# Patient Record
Sex: Male | Born: 1940 | Race: White | Hispanic: No | Smoking: Current every day smoker
Health system: Southern US, Community
[De-identification: ages and names within clinical notes are randomized; demographics above are authoritative.]

## PROBLEM LIST (undated history)

## (undated) DIAGNOSIS — I739 Peripheral vascular disease, unspecified: Secondary | ICD-10-CM

## (undated) DIAGNOSIS — F329 Major depressive disorder, single episode, unspecified: Secondary | ICD-10-CM

## (undated) DIAGNOSIS — E039 Hypothyroidism, unspecified: Secondary | ICD-10-CM

## (undated) DIAGNOSIS — F0150 Vascular dementia without behavioral disturbance: Secondary | ICD-10-CM

## (undated) DIAGNOSIS — J449 Chronic obstructive pulmonary disease, unspecified: Secondary | ICD-10-CM

## (undated) DIAGNOSIS — I10 Essential (primary) hypertension: Secondary | ICD-10-CM

## (undated) DIAGNOSIS — F32A Depression, unspecified: Secondary | ICD-10-CM

## (undated) DIAGNOSIS — F419 Anxiety disorder, unspecified: Secondary | ICD-10-CM

## (undated) DIAGNOSIS — E079 Disorder of thyroid, unspecified: Secondary | ICD-10-CM

## (undated) DIAGNOSIS — F0390 Unspecified dementia without behavioral disturbance: Secondary | ICD-10-CM

## (undated) DIAGNOSIS — I350 Nonrheumatic aortic (valve) stenosis: Secondary | ICD-10-CM

## (undated) HISTORY — DX: Nonrheumatic aortic (valve) stenosis: I35.0

## (undated) HISTORY — DX: Peripheral vascular disease, unspecified: I73.9

## (undated) HISTORY — DX: Major depressive disorder, single episode, unspecified: F32.9

## (undated) HISTORY — DX: Anxiety disorder, unspecified: F41.9

## (undated) HISTORY — DX: Vascular dementia, unspecified severity, without behavioral disturbance, psychotic disturbance, mood disturbance, and anxiety: F01.50

## (undated) HISTORY — PX: OTHER SURGICAL HISTORY: SHX169

## (undated) HISTORY — DX: Essential (primary) hypertension: I10

## (undated) HISTORY — DX: Depression, unspecified: F32.A

## (undated) HISTORY — DX: Hypothyroidism, unspecified: E03.9

---

## 2000-12-11 ENCOUNTER — Encounter: Payer: Self-pay | Admitting: Family Medicine

## 2000-12-11 ENCOUNTER — Ambulatory Visit (HOSPITAL_COMMUNITY): Admission: RE | Admit: 2000-12-11 | Discharge: 2000-12-11 | Payer: Self-pay | Admitting: Family Medicine

## 2001-03-04 ENCOUNTER — Emergency Department (HOSPITAL_COMMUNITY): Admission: EM | Admit: 2001-03-04 | Discharge: 2001-03-04 | Payer: Self-pay | Admitting: Emergency Medicine

## 2001-05-10 ENCOUNTER — Encounter: Payer: Self-pay | Admitting: Family Medicine

## 2001-05-10 ENCOUNTER — Ambulatory Visit (HOSPITAL_COMMUNITY): Admission: RE | Admit: 2001-05-10 | Discharge: 2001-05-10 | Payer: Self-pay | Admitting: Family Medicine

## 2002-01-07 ENCOUNTER — Ambulatory Visit (HOSPITAL_COMMUNITY): Admission: RE | Admit: 2002-01-07 | Discharge: 2002-01-07 | Payer: Self-pay | Admitting: Family Medicine

## 2002-01-07 ENCOUNTER — Encounter: Payer: Self-pay | Admitting: Family Medicine

## 2005-07-21 DIAGNOSIS — I739 Peripheral vascular disease, unspecified: Secondary | ICD-10-CM

## 2005-07-21 HISTORY — DX: Peripheral vascular disease, unspecified: I73.9

## 2006-02-22 ENCOUNTER — Emergency Department (HOSPITAL_COMMUNITY): Admission: EM | Admit: 2006-02-22 | Discharge: 2006-02-22 | Payer: Self-pay | Admitting: Emergency Medicine

## 2006-03-17 ENCOUNTER — Ambulatory Visit (HOSPITAL_COMMUNITY): Admission: RE | Admit: 2006-03-17 | Discharge: 2006-03-19 | Payer: Self-pay | Admitting: Vascular Surgery

## 2006-03-17 ENCOUNTER — Ambulatory Visit: Payer: Self-pay | Admitting: Cardiology

## 2006-03-18 ENCOUNTER — Encounter: Payer: Self-pay | Admitting: Cardiology

## 2006-03-26 ENCOUNTER — Encounter (INDEPENDENT_AMBULATORY_CARE_PROVIDER_SITE_OTHER): Payer: Self-pay | Admitting: Specialist

## 2006-03-26 ENCOUNTER — Inpatient Hospital Stay (HOSPITAL_COMMUNITY): Admission: RE | Admit: 2006-03-26 | Discharge: 2006-03-31 | Payer: Self-pay | Admitting: Vascular Surgery

## 2006-03-27 ENCOUNTER — Encounter: Payer: Self-pay | Admitting: Vascular Surgery

## 2006-11-19 IMAGING — CR DG FOOT COMPLETE 3+V*L*
3 series · 3 of 3 positions shown · non-contrast
Comparison: none

CLINICAL DATA: Fourth and fifth digits of left foot are black. 
 LEFT FOOT ? 3 VIEW:

[view not recorded (1 of 3)]
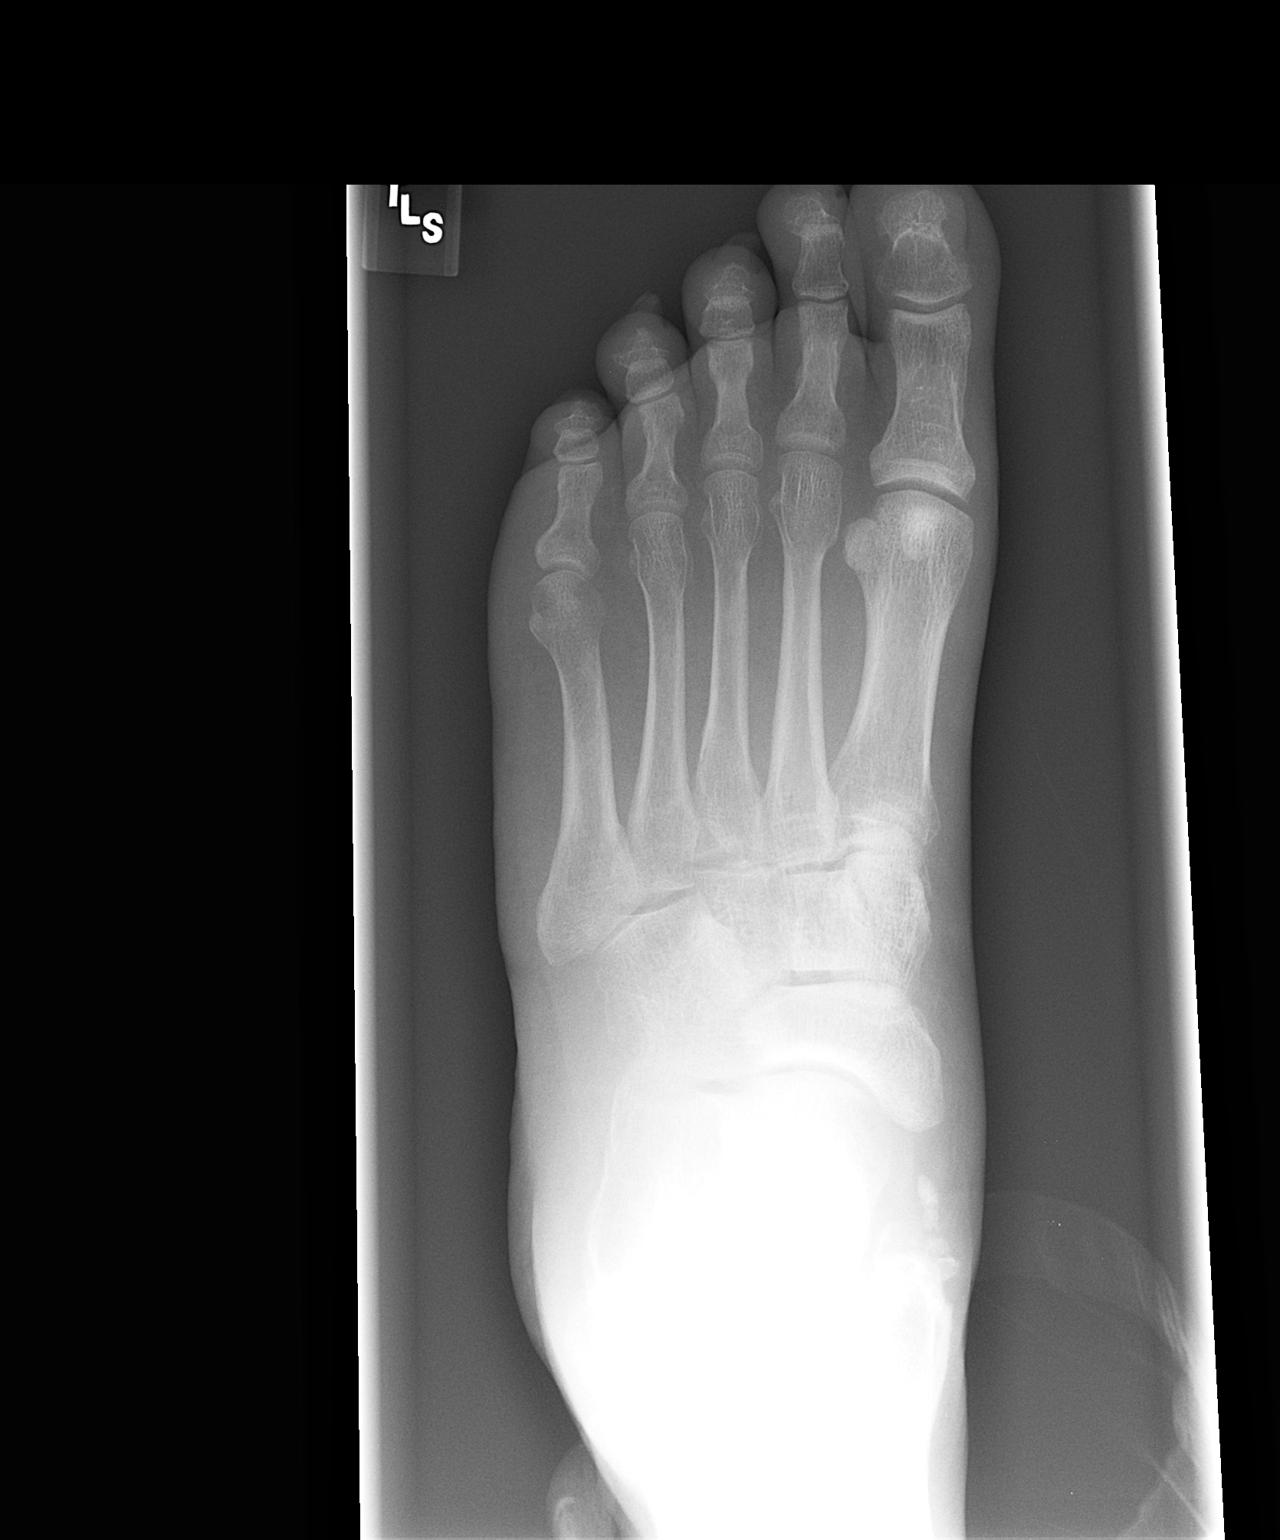

[view not recorded (2 of 3)]
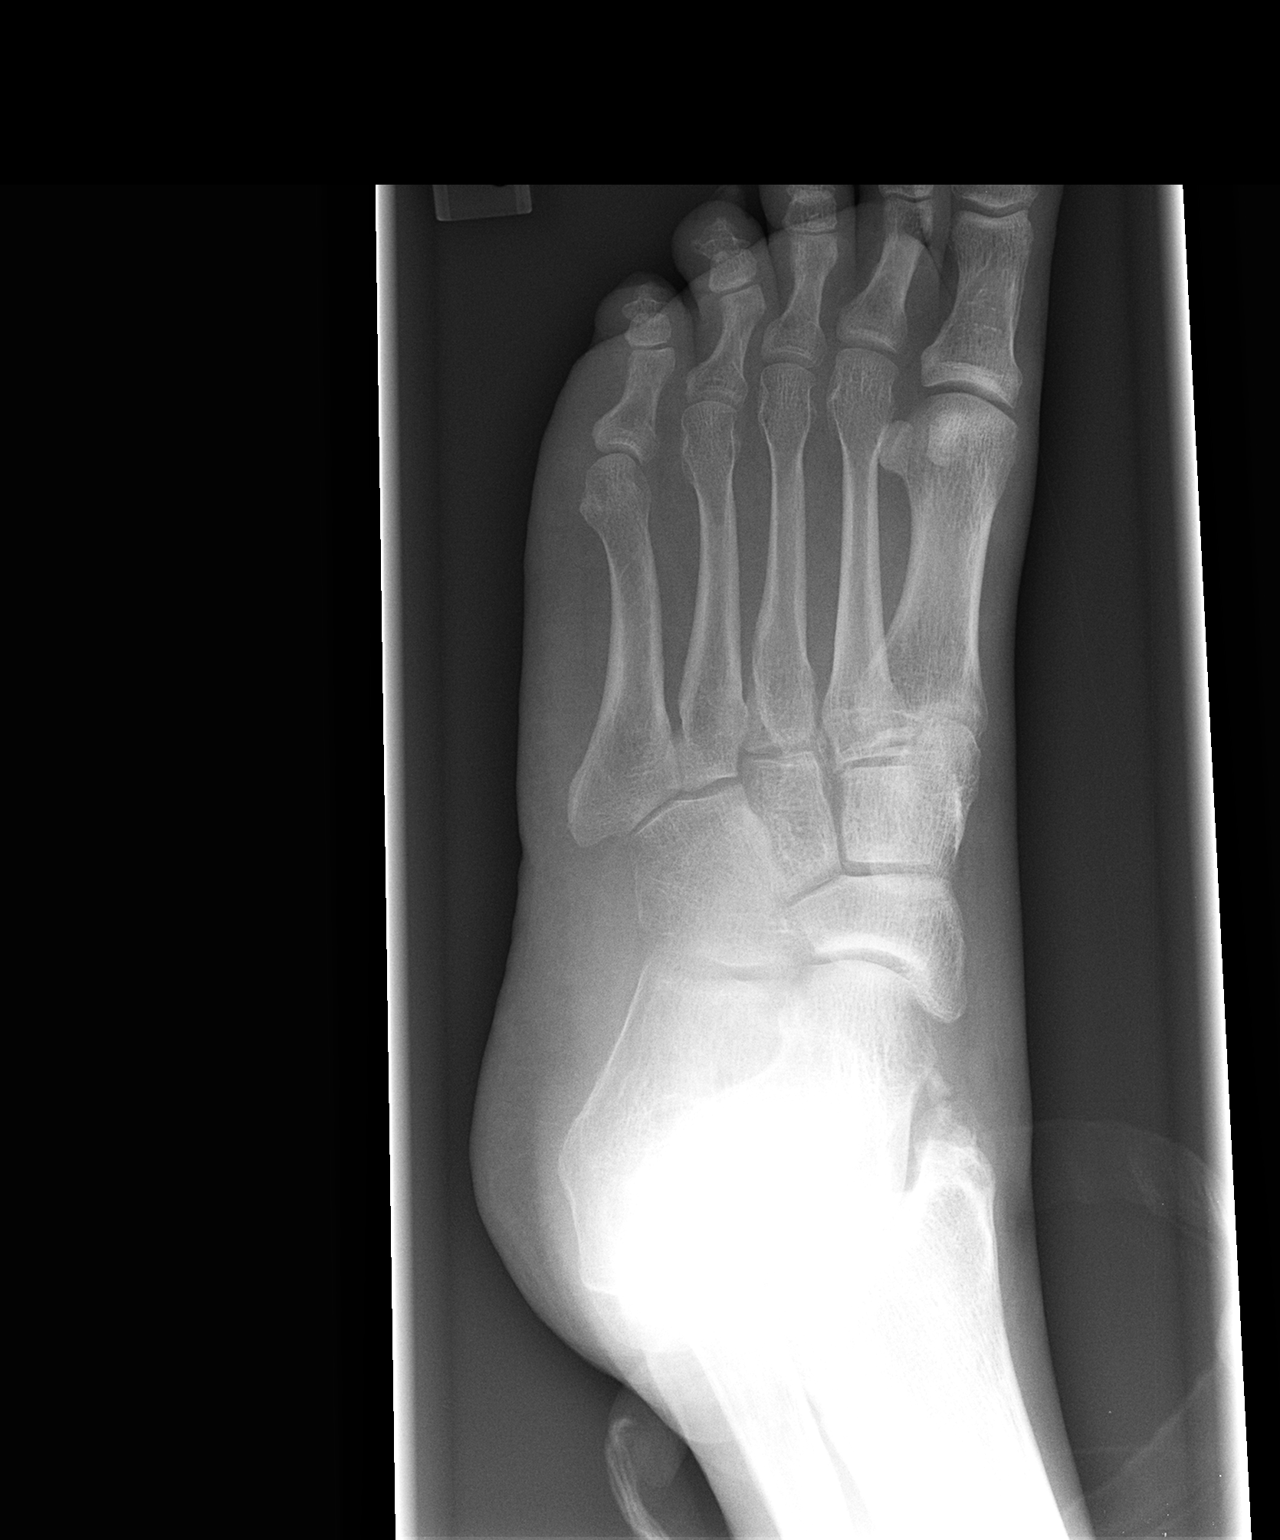

[view not recorded (3 of 3)]
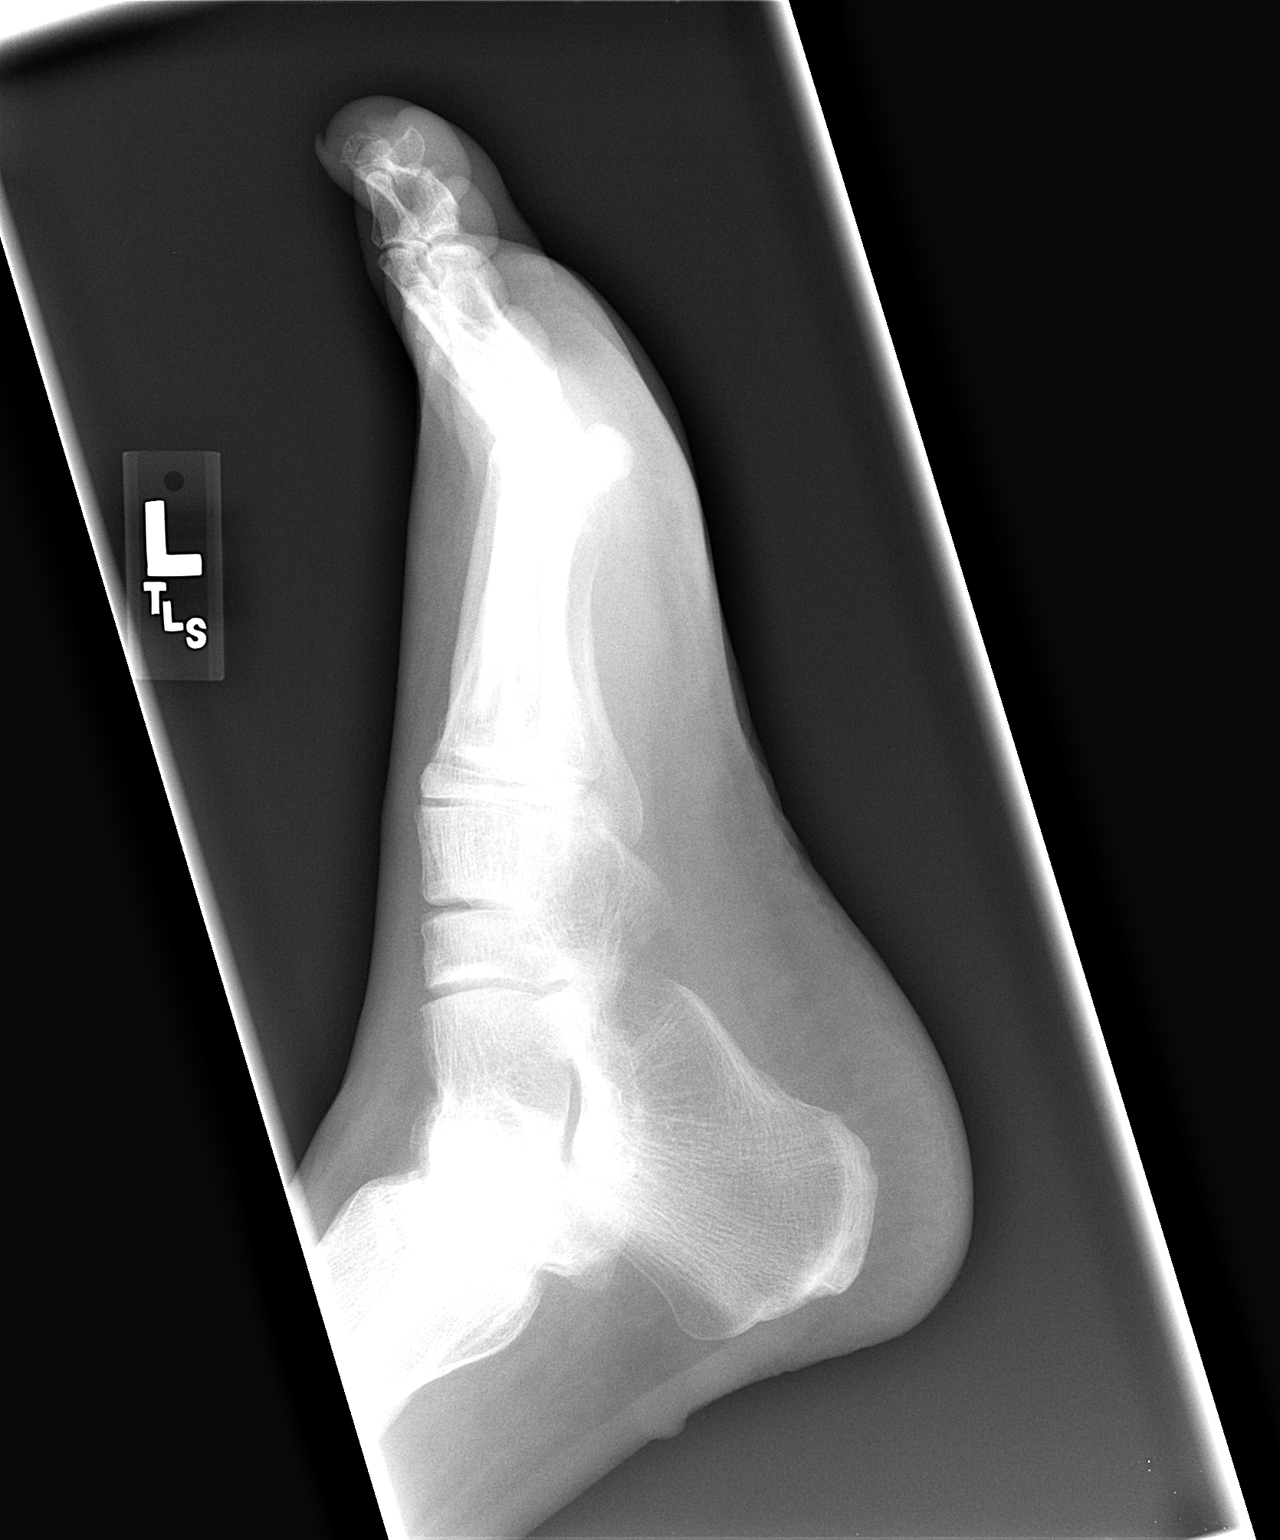

[3 of 3 positions shown; findings below may reference images not displayed]

FINDINGS: There are no acute radiographic abnormalities.  Specifically, no fractures or dislocations.
IMPRESSION: No acute findings.

## 2010-12-06 NOTE — Consult Note (Addendum)
NAME:  Jacob Barton, Jacob Barton               ACCOUNT NO.:  [id number]   MEDICAL RECORD NO.:  [id number]          PATIENT TYPE:  OIB   LOCATION:  2807                         FACILITY:  [facility]   PHYSICIAN:  Madolyn Frieze. 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  He has no prior cardiac history by report.  He  is a poor historian, but he denies any exertional chest pain.  He does have  some dyspnea on exertion, but there is no orthopnea or PND.  He recently was  found to have peripheral vascular disease after complaining of toe  infection/pain.  ABIs at [facility] revealed 0.35 on the left and 0.5 on the  right.  He had an arteriogram today, as well as a left iliac stent.  He also  needs left femoral popliteal bypass surgery.  Cardiology is asked to  evaluate preoperatively.  He is presently on Vicodin as needed and no other  medications.  HE IS ALLERGIC TO PENICILLIN.   PAST MEDICAL HISTORY:  He denies any hypertension or hyperlipidemia and he  denies any diabetes mellitus.  He has questionable history of mental  retardation.  He has had previous left ankle surgery.   FAMILY HISTORY:  His family history is positive for coronary artery disease  in his mother, father and siblings.  Age is unknown.   SOCIAL HISTORY:  He has a long history of tobacco abuse, but he does not  consume alcohol.   REVIEW OF SYSTEMS:  He denies any headaches, fevers or chills.  There is no  productive cough or hemoptysis.  There is no dysphagia, odynophagia, melena  or hematochezia.  There is no dysuria or hematuria.  There is no ____ QA                                                           MARKER: 118____ for seizure activity.  There  is no orthopnea, PND or pe  edema by his report.  The remainder systems are negative.   PHYSICAL EXAM:  VITAL SIGNS:  His blood pressure is 154/60 and his pulse is  60.  He has a respiratory rate of 18.  GENERAL:  He is well-developed, well-nourished in no acute distress.  His  skin is warm and dry.  He does not appear to be depressed and there is no  peripheral clubbing.  HEENT:  His HEENT is unremarkable with ____ QA MARKER: 136 ____.       NECK:  His neck is supple with a normal ____ QA MARKER: 139  ____bilaterally and I cannot appreciate bruits.  There is no  distention and no thyromegaly is noted.  CHEST:  His chest is clear to auscultation anteriorly.  Expansion is normal.  Note, the patient is lying flat following his arteriogram.  ABDOMINAL EXAM:  Nontender.  Positive bowel sounds.  No hepatosplenomegaly.  No masses appreciated.  There is no abdominal bruit.  He has sheaths in  place in both femoral arteries.  EXTREMITIES:  His extremities show no edema.  On the left, there are  necrotic fourth and fifth digits.  I cannot palpate dorsalis pedis pulses.  NEUROLOGICAL EXAM:  Is grossly intact, although his mentation is somewhat  diminished.   His electrocardiogram shows normal sinus rhythm with left ventricular  hypertrophy.   DIAGNOSES:  1. Preoperative evaluation prior to peripheral vascular surgery.  2. Peripheral vascular disease.  3. Ischemic fourth and fifth digits on the left lower extremity.  4. Probable chronic obstructive pulmonary disease.  5. Hypertension.  6. Tobacco abuse.   PLAN:  [name] is for preop evaluation prior to peripheral vascular  surgery.  He has demonstrated significant peripheral vascular disease and  also has a long history of tobacco use and a positive family history.  He  also has hypertension.  I, therefore feel that we should risk stratify prior  to his surgery and we will schedule him for  an adenosine Myoview.  I will  also schedule him to have an echocardiogram.  If there is no significant  ischemia, then I think he can proceed safely.  I think, given his vascular  disease, he would warrant aspirin, Lopressor and a statin.  He will need  followup lipids and liver functions in 6 weeks.  He will need to discontinue  his tobacco use.  We will follow while he is in the hospital.           ______________________________  Madolyn Frieze. Jens Som, MD,FACC     BSC/MEDQ  D:  03/17/2006  T:  03/17/2006  Job:  [id number]

## 2010-12-06 NOTE — Op Note (Signed)
NAME:  Jacob Barton, Jacob Barton               ACCOUNT NO.:  [id number]   MEDICAL RECORD NO.:  [id number]          PATIENT TYPE:  INP   LOCATION:  2550                         FACILITY:  [facility]   PHYSICIAN:  Janetta Hora. Fields, MD  DATE OF BIRTH:  1940/12/29   DATE OF PROCEDURE:  03/26/2006  DATE OF DISCHARGE:                                 OPERATIVE REPORT   PROCEDURES:  1. Left femoral to below-knee popliteal bypass with nonreversed greater      saphenous vein.  2. Intraoperative arteriogram.  3. 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   PREOPERATIVE DIAGNOSIS:  Gangrene toes left foot.   POSTOPERATIVE DIAGNOSIS:  Gangrene toes left foot.   ANESTHESIA:  General.   ASSISTANT:  Wayne Doylene Canning, PA-C.   OPERATIVE FINDINGS:  1. A 3 mm greater saphenous vein, nonreversed.  2. Lymph nodes sent for pathology.  3. Two-vessel runoff via peroneal and posterior tibial artery.   OPERATIVE DETAILS:  After obtaining informed consent, the patient was taken  to the operating room.  The patient was placed in the supine position on the  operating table.  After induction of general anesthesia and endotracheal  intubation, a Foley catheter was placed.  Next the patient's entire left and  right lower extremities were prepped and draped in the usual sterile  fashion.  A longitudinal incision was made in the left groin over the left  common femoral artery.  Dissection was carried down to the level of the left  common femoral artery.  There were two large lymph nodes overlying this.  These were excised and sent to pathology as a specimen.  Next, the left  common femoral artery was dissected free circumferentially.  Profunda  femoris and superficial femoral arteries were dissected free  circumferentially. A few small side branches were also dissected free  circumferentially and controlled with vessel loops.  Next, attention was  turned to the greater saphenous vein.  This was harvested from the  saphenofemoral  junction through several skip incisions down the leg.  The  vein was of good quality in its proximal half and then became fairly small  but was approximately 3 mm in diameter at its distalmost portion.  Next, an  incision was made on the medial aspect of the leg on the left side.  Incision was made through the muscle and carried down into the popliteal  space.  The popliteal artery was dissected free circumferentially.  It was  quite inflamed adjacent to the artery. Proximal and distal control were  obtained with vessel loops.  The greater saphenous vein was then fully  excised through skip incisions.  The distal portion was doubly clipped.  It  was then transected.  The vein was then harvested all the way up to the  level of the saphenofemoral junction.  This was controlled with a right-  angle clamp and the vein transected and placed in vein preservation  solution.  The stump of the saphenofemoral junction was then oversewn with a  5-0 Prolene suture.  Next, the vein was thoroughly irrigated and the dilated  with heparinized saline.  Small side branches were inspected.  They were  ligated divided between silk ties or clips.  The vein was fairly small in  its lower portion, and this required repair with sutures of 7-0 Prolene in  multiple locations.  After the vein had been distended, the patient was  given 5000 units of intravenous heparin.  A tunnel was created from the  below-knee incision between the heads of the gastrocnemius and subsartorial  up to the groin just prior to heparinization.  Next, the common femoral  artery was controlled with a Cooley clamp.  Distal control was obtained with  vessel loops.  A longitudinal arteriotomy was made just above the femoral  bifurcation.  The vein was spatulated and sewn end of vein to side of artery  using a running 6-0 Prolene suture.  Just prior to completion of the  anastomosis, this was forward bled, backbled, and thoroughly flushed.   Anastomosis was secured. Clamps were released.  The vein had been placed in  a nonreversed configuration.  A valvulotome was then brought up on the  operative field, and the valves were lysed.  The graft was then clamped  distally with a fine bulldog clamp and marked with a pen for orientation.  The graft was then taken through the subsartorial tunnel down to the level  of the below-knee popliteal space.  The popliteal artery was controlled  proximally and distally with vessel loops.  The vein was cut to length with  the leg straightened.  A longitudinal arteriotomy was made in the side of  the popliteal artery, and the vein was then sewn end of vein to side of  artery using a running 6-0 Prolene suture.  Just prior to completion of the  anastomosis, this was forward bled, backbled, and thoroughly flushed.  On  forward bleeding the vein graft, however, the flow was not brisk.  Therefore, a #3 Fogarty catheter was passed through the graft a couple of  times.  No clot was retrieved, but the inflow was significantly improved.  The anastomosis was then secured.  The graft was then first flushed up the  proximal popliteal artery and then down into the distal popliteal artery.  Foot was pink and warm immediately.  Hemostasis was obtained.  Next, a  completion arteriogram was obtained using inflow occlusion on the vein  graft, and a 21 gauge butterfly needle was introduced into the proximal  portion of the vein graft.  This showed a widely patent anastomosis, with  runoff via the peroneal and posterior tibial arteries.  The anterior tibial  artery was occluded in the mid-leg.  Next, the butterfly needle was removed,  and a single 6-0 Prolene suture used to repair the hole.  Hemostasis was  obtained in all incisions.  Deep layers of all incisions were closed with  running 2-0 Vicryl suture.  Superficial layers were closed with running 3-0 Vicryl suture.  Skin of all incisions was then closed with  staples.  Attention was then turned to the foot.  The patient had gangrene of the toes  4 and 5 on the left foot.  Left foot was prepped and draped in sterile  technique.  A circumferential incision was made around the base of the  fourth and fifth toes.  These were excised with bone cutters and passed off  table as specimen.  Bone was then rongeured back until there no tension no  tension on the skin edges.  The skin edges  were then thoroughly irrigated  with normal saline solution.  This was then reapproximated with interrupted  3-0 nylon sutures.  Dry sterile dressings were applied.  The patient  tolerated the procedure well, and there were no complications.  Instrument,  sponge, and needle counts were correct at the case.  The patient was taken  to the recovery room in stable condition.      Janetta Hora. Fields, MD  Electronically Signed     CEF/MEDQ  D:  03/26/2006  T:  03/26/2006  Job:  [id number]

## 2010-12-06 NOTE — Op Note (Signed)
NAME:  Jacob Barton, Jacob Barton               ACCOUNT NO.:  [id number]   MEDICAL RECORD NO.:  [id number]          PATIENT TYPE:  OIB   LOCATION:  5016                         FACILITY:  [facility]   PHYSICIAN:  Janetta Hora. Fields, MD  DATE OF BIRTH:  03-01-1941   DATE OF PROCEDURE:  03/17/2006  DATE OF DISCHARGE:  03/19/2006                                 OPERATIVE REPORT   PROCEDURE:  1. Aortogram with bilateral lower extremity run off.  2. Angioplasty and stenting of left external and common iliac artery.   PREOPERATIVE DIAGNOSIS:  Gangrene left foot.   POSTOPERATIVE DIAGNOSIS:  Gangrene left foot.   ANESTHESIA:  Local.   ASSISTANT:  Nurse.   OPERATIVE DETAIL:  After obtaining informed consent, the patient taken to  the PV lab.  The patient placed in supine position on the angio table.  Next, both groins were prepped and draped in the usual sterile fashion.  Local anesthesia was infiltrated over the right common femoral artery.  A  Majestic needle was used to cannulate the right common femoral artery.  A  0.035 J-tip guide wire threaded into the abdominal aorta under fluoroscopic  guidance.  Next a 5-French sheath was placed over the guidewire in the right  femoral artery.  5-French pigtail catheter was then placed over the  guidewire into the abdominal aorta.  Abdominal aortogram was obtained.  This  shows the left and right renal arteries are widely patent.  The abdominal  aorta has mild atherosclerotic changes.  The right common iliac artery has  mild narrowing at its origin approximately 10%.  Left common iliac artery is  patent at its origin.  There is a high-grade stenosis in the left common  iliac/external iliac artery right at the iliac bifurcation on the left side.  The right side has no significant stenosis.   Several oblique views were obtained of the pelvis to further demonstrate the  high-grade stenosis and the common external iliac junction on the left side.   Next lower  extremity runoff views were obtained.  This shows that the distal  left and right external iliac arteries were widely patent.  The left and  right common femoral arteries were patent.  The origin of the superficial  femoral artery is patent bilaterally but this occludes in the mid thigh.  The profunda femoris artery is patent bilaterally.  The above-knee popliteal  artery is reconstituted right at the adductor hiatus on the right.  The  above-knee popliteal artery is reconstituted on the left just above the  femoral condyle.  Popliteal artery is patent bilaterally.  On the right leg  there is filling of the anterior tibial artery but this occludes just after  its origin.  The peroneal artery on the right side is patent at its origin  but occludes in the mid leg.  The dominant runoff vessel on the right side  is the posterior tibial artery.  On the left side anterior tibial artery  does fill but is moderately diseased and small.  Again on the left side, the  posterior tibial artery is the dominant  runoff vessel.  The peroneal artery  is fairly diminutive.   Next, local anesthesia was infiltrated over the left common femoral artery.  Majestic needle was used to cannulate the left common femoral artery and a  0.035 Wholey wire advanced into the left iliac system.  A 6-French bright  tip sheath was then placed over the guidewire into the left iliac system.  The patient was given 5000 units of intravenous heparin.  A 7 mm diameter  stent was brought up in the operative field and after using a marker take  the precise area for stent deployment was determined with half the stent  deployed above and below the iliac bifurcation.  Completion arteriogram  shows no significant residual stenosis.  Additionally the left internal  iliac artery is widely patent.   Next the guidewires were removed and the sheath left in place to be removed  in the holding area until the patient's ACT had come down to  reasonable  value.  The patient tolerated procedure well and there were no  complications.  The patient taken to the holding area in stable condition.      Janetta Hora. Fields, MD  Electronically Signed     CEF/MEDQ  D:  04/21/2006  T:  04/22/2006  Job:  [id number]

## 2010-12-06 NOTE — Op Note (Signed)
NAME:  Jacob Barton, Jacob Barton               ACCOUNT NO.:  [id number]   MEDICAL RECORD NO.:  [id number]          PATIENT TYPE:  OIB   LOCATION:  5016                         FACILITY:  [facility]   PHYSICIAN:  Janetta Hora. Fields, MD  DATE OF BIRTH:  03-02-1941   DATE OF PROCEDURE:  DATE OF DISCHARGE:                                 OPERATIVE REPORT   PROCEDURES:  1. Aortogram with bilateral lower extremity runoff.  2. Angioplasty and stenting of left external iliac artery.   PREOPERATIVE DIAGNOSIS:  Gangrene left foot.   POSTOPERATIVE DIAGNOSIS:  Gangrene left foot.   ANESTHESIA:  Local.   OPERATIVE DETAILS:  After obtaining informed consent, the patient was taken  to the PV lab.  The patient was placed in supine position on the angio  table.  Next, local anesthesia was infiltrated over the right common femoral  artery.  A Majestic needle was then used to cannulate the right common  femoral artery and a 0.035 Wholey wire advanced into the abdominal aorta.  A  5 French sheath was then placed over the guidewire in the right common  femoral artery.  A 5 French pigtail catheter was then placed over the  guidewire in the abdominal aorta.  Abdominal aortogram was obtained.  This  shows bilateral single patent renal arteries.  There is mild atherosclerotic  change of the abdominal aorta.  Left and right common iliac arteries have  mild atherosclerotic disease but no significant flow-limiting stenosis.  Pigtail catheter was then pulled down towards the aortic bifurcation and AP  view of the pelvis as well as oblique views of the pelvis were performed.  This shows a high-grade stenosis of the left external iliac artery at the  iliac bifurcation.  The left and right internal iliac arteries are small but  patent.  Left and right external iliac arteries are patent distally.  Common  femoral arteries were patent bilaterally.  The superficial femoral arteries  were small bilaterally and occluded in the mid  thigh.  Profunda femoris is  small on the left but patent.  The profunda femoris artery on the right side  is widely patent.  The above-knee popliteal artery reconstitutes on the  right side via collaterals from the profunda.  The above-knee popliteal  artery also reconstitutes on the left side via collaterals but is much more  heavily diseased.  Popliteal arteries are patent bilaterally.  The anterior  tibial artery occludes in the mid leg bilaterally.  Posterior tibial artery  occludes in the distal third of the leg via left side.  Peroneal artery is  quite large and is the runoff vessel to the left foot.  On the right side  the peroneal artery is fairly small and heavily diseased.  Posterior tibial  artery is the main runoff vessel to the right foot.   Next the left common femoral artery was accessed after sterile prep and  drape with Majestic needle.  A 0.035 Wholey wire was advanced up into the  iliac artery and across the stenosis and into the abdominal aorta.  A 6  French sheath was  then placed over the guidewire in left common femoral  artery.  Retrograde arteriogram was obtained to determine precise level of  the stenosis.  A 7 x 29 balloon expandable stent was then brought up on the  operative field and deployed to 10 atm for 1 minute at the level of stenosis  at the iliac bifurcation.  Completion arteriogram was then obtained.  This  shows no evidence of residual stenosis and no evidence of dissection.  Next  the guidewire was removed.  The sheaths were left in place to be removed  after the ACT was less than 175.  The patient was given 5000 units of  intravenous heparin, this after placing the sheath in the left iliac artery.   OPERATIVE FINDINGS:  1. High-grade stenosis, left external iliac artery.  2. A 7 x 29 left external iliac artery stent.  3. Single-vessel runoff on the right leg via posterior tibial artery,      single-vessel runoff on the left leg via peroneal  artery.      Janetta Hora. Fields, MD  Electronically Signed     CEF/MEDQ  D:  03/17/2006  T:  03/18/2006  Job:  [id number]

## 2010-12-06 NOTE — Discharge Summary (Signed)
[name], Jacob Barton NO.:  [id number]   MEDICAL RECORD NO.:  [id number]          PATIENT TYPE:  INP   LOCATION:  2041                         FACILITY:  [facility]   PHYSICIAN:  Janetta Hora. Fields, MD  DATE OF BIRTH:  Aug 15, 1940   DATE OF ADMISSION:  DATE OF DISCHARGE:  03/31/2006                                 DISCHARGE SUMMARY   HISTORY OF PRESENT ILLNESS:  The patient is a 70 year old male referred to  Dr. 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  The patient was recently admitted to [facility] where he was treated for an infection in his left foot.  This  occurred apparently when a wheelchair ran over his foot approximately 2  weeks prior to the admission.  On evaluation by Dr. Darrick Penna at the office, he  had been out of the hospital for approximately 1 week.  He continued to have  ongoing complaints of left foot pain.  The patient is known to have mental  retardation and has a difficult time giving a complete history.  He was  evaluated by Dr. Darrick Penna.  ABIs were reviewed from [facility] which  was 0.35 on the left and 0.5 on the right.  Thigh pressure was decreased  bilaterally, suggesting some component of aortoiliac disease.  He  subsequently has undergone arteriography, and this was reviewed by Dr.  Darrick Penna, and he was felt to be a candidate for surgical revascularization.  He was admitted this hospitalization for the procedure.   PAST MEDICAL HISTORY:  Unremarkable.   Past surgical history included left ankle surgery.  He was noted to be  hypertensive in the office on the date of admission but has no notable  history of hypertension, hypercholesterolemia, diabetes, or coronary artery  disease.   Medication prior to admission only includes some type of pain medication.   ALLERGIES:  PENICILLIN, BUT HE IS UNCERTAIN OF THE REACTION TO THIS.   FAMILY HISTORY, SOCIAL HISTORY, REVIEW OF  SYSTEMS, AND PHYSICAL EXAMINATION:  Please see the history and physical done at the time of admission.  Of note,  the patient is a long-term, chronic tobacco abuser.   HOSPITAL COURSE:  The patient was admitted electively on March 26, 2006,  taken to the operating room, at which time he underwent the following  procedure:  Left femoral to below-knee popliteal bypass with a nonreverse  greater saphenous vein.  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  He also had normal intraoperative arteriogram.  The patient  tolerated this procedure well and was taken to the postanesthesia care unit  in stable condition.   POSTOPERATIVE COURSE:  The patient has done well postoperatively.  He has  excellent Doppler signals in his left foot on both the dorsalis pedis and  posterior tibial.  His incisions are all healing well without evidence of  infection.  His toe amputation sites are healing well.  His laboratory  values postoperatively do reveal a postoperative anemia, but this is felt to  be stable.  Hemoglobin and hematocrit dated March 27, 2006, were 9.7 and  29.0 respectively.  Electrolytes, BUN, and closed were all within normal  limits with the exception of slightly elevated glucose of 137.  The  patient's overall status it noted to be somewhat debilitated and  deconditioned preoperatively.  Arrangements will be made for a rolling  walker.  Additional arrangements are made for nursing and physical therapy  outpatient home health care.  Overall, his status is felt to be stable for  tentative discharge in the morning of March 31, 2006, pending morning  round reevaluation, specifically reassessing his ambulation status.   CONDITION ON DISCHARGE:  Stable, improving.   MEDICATIONS ON DISCHARGE INCLUDE THE FOLLOWING:  1. Toprol-XL 25 mg daily.  2. Aspirin 325 mg daily.  3. Zocor 40 mg daily.   Note:  Please note the Toprol was started for postoperative PACs  and mild  hypertension, both preoperatively and postoperatively.  These both have  improved on the Toprol.  Additionally, Zocor was started due to the severity  of his peripheral vascular occlusive disease.  These medications will be  needed to be addressed by his primary physician at a later date as to long-  term, continued use.  For pain, the patient will be given a prescription for  Tylox, 1 or 2 q.6 hours as needed.  Followup will be arranged for the  patient to have sutures and staples removed on April 06, 2006, at the  office.  Additionally, he will see Dr. Darrick Penna on April 24, 2006, with ABIs  to be repeated at that time.   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   OTHER DIAGNOSES INCLUDE:  As previously listed per the history, also,  postoperative anemia.      Rowe Clack, P.A.-C.      Janetta Hora. Fields, MD  Electronically Signed    WEG/MEDQ  D:  03/30/2006  T:  03/30/2006  Job:  [id number]   cc:   Janetta Hora. Darrick Penna, MD  Freida Busman. Clelia Croft, MD

## 2017-07-28 ENCOUNTER — Encounter (HOSPITAL_COMMUNITY): Payer: Self-pay | Admitting: Emergency Medicine

## 2017-07-28 ENCOUNTER — Emergency Department (HOSPITAL_COMMUNITY)
Admission: EM | Admit: 2017-07-28 | Discharge: 2017-07-28 | Disposition: A | Discharge status: Home / Self Care | Payer: Medicare Other | Attending: Emergency Medicine | Admitting: Emergency Medicine

## 2017-07-28 ENCOUNTER — Emergency Department (HOSPITAL_COMMUNITY): Payer: Medicare Other

## 2017-07-28 DIAGNOSIS — S0990XA Unspecified injury of head, initial encounter: Secondary | ICD-10-CM

## 2017-07-28 DIAGNOSIS — F0390 Unspecified dementia without behavioral disturbance: Secondary | ICD-10-CM | POA: Insufficient documentation

## 2017-07-28 DIAGNOSIS — Y929 Unspecified place or not applicable: Secondary | ICD-10-CM | POA: Insufficient documentation

## 2017-07-28 DIAGNOSIS — I10 Essential (primary) hypertension: Secondary | ICD-10-CM | POA: Insufficient documentation

## 2017-07-28 DIAGNOSIS — W01198A Fall on same level from slipping, tripping and stumbling with subsequent striking against other object, initial encounter: Secondary | ICD-10-CM | POA: Insufficient documentation

## 2017-07-28 DIAGNOSIS — Y999 Unspecified external cause status: Secondary | ICD-10-CM | POA: Diagnosis not present

## 2017-07-28 DIAGNOSIS — Y939 Activity, unspecified: Secondary | ICD-10-CM | POA: Diagnosis not present

## 2017-07-28 DIAGNOSIS — Z23 Encounter for immunization: Secondary | ICD-10-CM | POA: Diagnosis not present

## 2017-07-28 DIAGNOSIS — S0101XA Laceration without foreign body of scalp, initial encounter: Secondary | ICD-10-CM | POA: Diagnosis not present

## 2017-07-28 DIAGNOSIS — J449 Chronic obstructive pulmonary disease, unspecified: Secondary | ICD-10-CM | POA: Insufficient documentation

## 2017-07-28 HISTORY — DX: Unspecified dementia without behavioral disturbance: F03.90

## 2017-07-28 HISTORY — DX: Essential (primary) hypertension: I10

## 2017-07-28 HISTORY — DX: Disorder of thyroid, unspecified: E07.9

## 2017-07-28 HISTORY — DX: Chronic obstructive pulmonary disease, unspecified: J44.9

## 2017-07-28 MED ORDER — TETANUS-DIPHTH-ACELL PERTUSSIS 5-2.5-18.5 LF-MCG/0.5 IM SUSP
0.5000 mL | Freq: Once | INTRAMUSCULAR | Status: AC
  Administered 2017-07-28: 0.5 mL via INTRAMUSCULAR
  Filled 2017-07-28: qty 0.5

## 2017-07-28 NOTE — ED Triage Notes (Signed)
Pt had witnessed fall from standing position while getting meds this morning.  Hematoma to head and skin tear to right elbow.

## 2017-07-28 NOTE — ED Provider Notes (Signed)
[facility] Provider Note   CSN: [id number] Arrival date & time: 07/28/17  [id number]     History   Chief Complaint Chief Complaint  Patient presents with  . Fall    HPI Jacob Barton is a 77 y.o. male.  HPI  77 year old male sent for evaluation after fall.  Apparently this was witnessed.  Fell from standing.  No report of loss of consciousness.  Apparently patient is at his baseline (?).  Does respond to questioning but mostly by nodding his head.   Past Medical History:  Diagnosis Date  . COPD (chronic obstructive pulmonary disease) (HCC)   . Dementia   . Hypertension   . Thyroid disease     There are no active problems to display for this patient.   History reviewed. No pertinent surgical history.     Home Medications    Prior to Admission medications   Not on File    Family History History reviewed. No pertinent family history.  Social History Social History   Tobacco Use  . Smoking status: Not on file  Substance Use Topics  . Alcohol use: Not on file  . Drug use: Not on file     Allergies   Penicillins   Review of Systems Review of Systems  All systems reviewed and negative, other than as noted in HPI.  Physical Exam Updated Vital Signs BP (!) 164/62 (BP Location: Right Arm)   Pulse 76   Temp 97.8 F (36.6 C) (Oral)   Resp 18   Ht 5\' 10"  (1.778 m)   Wt 73.7 kg (162 lb 8 oz)   SpO2 100%   BMI 23.32 kg/m   Physical Exam  Constitutional: He appears well-developed and well-nourished. No distress.  HENT:  Head: Normocephalic.  Approximately 2 cm laceration in the left high parietal region.  No active bleeding.   Eyes: Conjunctivae are normal. Right eye exhibits no discharge. Left eye exhibits no discharge.  Neck: Neck supple.  Cardiovascular: Normal rate, regular rhythm and normal heart sounds. Exam reveals no gallop and no friction rub.  No murmur heard. Pulmonary/Chest: Effort normal and breath sounds normal.  No respiratory distress.  Abdominal: Soft. He exhibits no distension. There is no tenderness.  Musculoskeletal: He exhibits no edema or tenderness.  No apparent midline spinal tenderness.  No apparent pain with range of motion of the large joints.  He does have a very tiny abrasion to his right elbow.  There is no soft tissue swelling.  He does not react with palpation.  Neurological: He is alert.  Answers questions by nodding head "yes" and "no." Did respond verbally to some but answers very difficult for me to understand. Follows simple commands such as sit up in bed, squeeze fingers, move extremities, etc. NO focal motor deficit noted.   Skin: Skin is warm and dry.  Psychiatric: He has a normal mood and affect. His behavior is normal. Thought content normal.  Nursing note and vitals reviewed.    ED Treatments / Results  Labs (all labs ordered are listed, but only abnormal results are displayed) Labs Reviewed - No data to display  EKG  EKG Interpretation None       Radiology Ct Head Wo Contrast  Result Date: 07/28/2017 CLINICAL DATA:  Fall.  Hematoma to head. EXAM: CT HEAD WITHOUT CONTRAST TECHNIQUE: Contiguous axial images were obtained from the base of the skull through the vertex without intravenous contrast. COMPARISON:  None. FINDINGS: Brain: There is atrophy  and chronic small vessel disease changes. No acute intracranial abnormality. Specifically, no hemorrhage, hydrocephalus, mass lesion, acute infarction, or significant intracranial injury. Vascular: No hyperdense vessel or unexpected calcification. Skull: No acute calvarial abnormality. Sinuses/Orbits: Visualized paranasal sinuses and mastoids clear. Orbital soft tissues unremarkable. Other: None IMPRESSION: No acute intracranial abnormality. Atrophy, chronic microvascular disease. Electronically Signed   By: Charlett Nose M.D.   On: 07/28/2017 08:59    Procedures Procedures (including critical care time)  LACERATION  REPAIR Performed by: Raeford Razor Authorized by: Raeford Razor Consent: Verbal consent obtained. Risks and benefits: risks, benefits and alternatives were discussed Consent given by: patient Patient identity confirmed: provided demographic data Prepped and Draped in normal sterile fashion Wound explored  Laceration Location: scalp  Laceration Length: 2 cm  No Foreign Bodies seen or palpated  Anesthesia: local infiltration  Local anesthetic: none  Irrigation method: syringe Amount of cleaning: standard  Skin closure: staples  Number: 2  Patient tolerance: Patient tolerated the procedure well with no immediate complications.   Medications Ordered in ED Medications  Tdap (BOOSTRIX) injection 0.5 mL (not administered)     Initial Impression / Assessment and Plan / ED Course  I have reviewed the triage vital signs and the nursing notes.  Pertinent labs & imaging results that were available during my care of the patient were reviewed by me and considered in my medical decision making (see chart for details).     77 year old male with a fall from standing.  Small laceration which was stapled.  He has no focal motor deficits noted on exam.  No reported change of his baseline.  Will CT his head.  Does not appear to have any midline spinal tenderness or significant trauma elsewhere.  Head CT without significant acute abnormality.  Final Clinical Impressions(s) / ED Diagnoses   Final diagnoses:  Laceration of scalp, initial encounter  Injury of head, initial encounter    ED Discharge Orders    None       Raeford Razor, MD 07/28/17 [phone]

## 2018-11-25 ENCOUNTER — Telehealth: Payer: Self-pay | Admitting: Cardiology

## 2018-11-25 NOTE — Telephone Encounter (Signed)
Virtual Visit Pre-Appointment Phone Call  "(Name), I am calling you today to discuss your upcoming appointment. We are currently trying to limit exposure to the virus that causes COVID-19 by seeing patients at home rather than in the office."  1. "What is the BEST phone number to call the day of the visit?" - include this in appointment notes  2. Do you have or have access to (through a family member/friend) a smartphone with video capability that we can use for your visit?" a. If yes - list this number in appt notes as cell (if different from BEST phone #) and list the appointment type as a VIDEO visit in appointment notes b. If no - list the appointment type as a PHONE visit in appointment notes  3. Confirm consent - "In the setting of the current Covid19 crisis, you are scheduled for a (phone or video) visit with your provider on (date) at (time).  Just as we do with many in-office visits, in order for you to participate in this visit, we must obtain consent.  If you'd like, I can send this to your mychart (if signed up) or email for you to review.  Otherwise, I can obtain your verbal consent now.  All virtual visits are billed to your insurance company just like a normal visit would be.  By agreeing to a virtual visit, we'd like you to understand that the technology does not allow for your provider to perform an examination, and thus may limit your provider's ability to fully assess your condition. If your provider identifies any concerns that need to be evaluated in person, we will make arrangements to do so.  Finally, though the technology is pretty good, we cannot assure that it will always work on either your or our end, and in the setting of a video visit, we may have to convert it to a phone-only visit.  In either situation, we cannot ensure that we have a secure connection.  Are you willing to proceed?" STAFF: Did the patient verbally acknowledge consent to telehealth visit? Document  YES/NO here: Yes  4. Advise patient to be prepared - "Two hours prior to your appointment, go ahead and check your blood pressure, pulse, oxygen saturation, and your weight (if you have the equipment to check those) and write them all down. When your visit starts, your provider will ask you for this information. If you have an Apple Watch or Kardia device, please plan to have heart rate information ready on the day of your appointment. Please have a pen and paper handy nearby the day of the visit as well."  5. Give patient instructions for MyChart download to smartphone OR [email or web address] as below if video visit (depending on what platform provider is using)  6. Inform patient they will receive a phone call 15 minutes prior to their appointment time (may be from unknown caller ID) so they should be prepared to answer    TELEPHONE CALL NOTE  GLOYD DARDAR has been deemed a candidate for a follow-up tele-health visit to limit community exposure during the Covid-19 pandemic. I spoke with the patient via phone to ensure availability of phone/video source, confirm preferred email & phone number, and discuss instructions and expectations.  I reminded JEAN-PAUL KOSH to be prepared with any vital sign and/or heart rhythm information that could potentially be obtained via home monitoring, at the time of his visit. I reminded GORJE ALBU to expect a phone call prior to  his visit.  Geraldine ContrasStephanie R Smith 11/25/2018 8:11 AM   INSTRUCTIONS FOR DOWNLOADING THE MYCHART APP TO SMARTPHONE  - The patient must first make sure to have activated MyChart and know their login information - If Apple, go to [employer] Store and type in MyChart in the search bar and download the app. If Android, ask patient to go to [employer] Play Store and type in [facility] in the search bar and download the app. The app is free but as with any other app downloads, their phone may require them to verify saved payment information or Apple/Android  password.  - The patient will need to then log into the app with their MyChart username and password, and select [facility] as their healthcare provider to link the account. When it is time for your visit, go to the MyChart app, find appointments, and click Begin Video Visit. Be sure to Select Allow for your device to access the Microphone and Camera for your visit. You will then be connected, and your provider will be with you shortly.  **If they have any issues connecting, or need assistance please contact MyChart service desk [phone])**  **If using a computer, in order to ensure the best quality for their visit they will need to use either of the following Internet Browsers: [employer] Edge, or Google Chrome**  IF USING DOXIMITY or DOXY.ME - The patient will receive a link just prior to their visit by text.     FULL LENGTH CONSENT FOR TELE-HEALTH VISIT   I hereby voluntarily request, consent and authorize [facility] and its employed or contracted physicians, physician assistants, nurse practitioners or other licensed health care professionals (the Practitioner), to provide me with telemedicine health care services (the Services") as deemed necessary by the treating Practitioner. I acknowledge and consent to receive the Services by the Practitioner via telemedicine. I understand that the telemedicine visit will involve communicating with the Practitioner through live audiovisual communication technology and the disclosure of certain medical information by electronic transmission. I acknowledge that I have been given the opportunity to request an in-person assessment or other available alternative prior to the telemedicine visit and am voluntarily participating in the telemedicine visit.  I understand that I have the right to withhold or withdraw my consent to the use of telemedicine in the course of my care at any time, without affecting my right to future care or treatment,  and that the Practitioner or I may terminate the telemedicine visit at any time. I understand that I have the right to inspect all information obtained and/or recorded in the course of the telemedicine visit and may receive copies of available information for a reasonable fee.  I understand that some of the potential risks of receiving the Services via telemedicine include:   Delay or interruption in medical evaluation due to technological equipment failure or disruption;  Information transmitted may not be sufficient (e.g. poor resolution of images) to allow for appropriate medical decision making by the Practitioner; and/or   In rare instances, security protocols could fail, causing a breach of personal health information.  Furthermore, I acknowledge that it is my responsibility to provide information about my medical history, conditions and care that is complete and accurate to the best of my ability. I acknowledge that Practitioner's advice, recommendations, and/or decision may be based on factors not within their control, such as incomplete or inaccurate data provided by me or distortions of diagnostic images or specimens that may result from electronic transmissions. I  understand that the practice of medicine is not an exact science and that Practitioner makes no warranties or guarantees regarding treatment outcomes. I acknowledge that I will receive a copy of this consent concurrently upon execution via email to the email address I last provided but may also request a printed copy by calling the office of [place].    I understand that my insurance will be billed for this visit.   I have read or had this consent read to me.  I understand the contents of this consent, which adequately explains the benefits and risks of the Services being provided via telemedicine.   I have been provided ample opportunity to ask questions regarding this consent and the Services and have had my questions  answered to my satisfaction.  I give my informed consent for the services to be provided through the use of telemedicine in my medical care  By participating in this telemedicine visit I agree to the above.

## 2018-11-26 ENCOUNTER — Encounter: Payer: Self-pay | Admitting: *Deleted

## 2018-11-26 ENCOUNTER — Encounter: Payer: Self-pay | Admitting: Cardiology

## 2018-11-26 NOTE — Progress Notes (Signed)
Virtual Visit via Telephone Note   This visit type was conducted due to national recommendations for restrictions regarding the COVID-19 Pandemic (e.g. social distancing) in an effort to limit this patient's exposure and mitigate transmission in our community.  Due to his co-morbid illnesses, this patient is at least at moderate risk for complications without adequate follow up.  This format is felt to be most appropriate for this patient at this time.  The patient did not have access to video technology/had technical difficulties with video requiring transitioning to audio format only (telephone).  All issues noted in this document were discussed and addressed.  No physical exam could be performed with this format.  Please refer to the patient's chart for his  consent to telehealth for [facility].   Date:  11/29/2018   ID:  Jacob Barton, DOB Jan 10, 1941, MRN [id number]  Patient Location: Other:  [facility] Provider Location: Home  PCP: Adriana SimasMatthew Ward, PA-C  Consulting Cardiologist: Jonelle SidleSamuel G. Haydan Wedig, MD   Evaluation Performed:  New Patient Evaluation  Chief Complaint:  Review ECG  History of Present Illness:    Jacob Jeffersonlfred E Barton is a 78 y.o. male referred for cardiology consultation by Adriana SimasMatthew Ward, PA-C with [facility] due to an abnormal ECG.  He is a resident in the memory care unit at [facility].  He does not communicate based on discussion with staff there, and I spoke with Joyce GrossKay who is the Careers advisermemory care manager.  As best I can ascertain, the patient was seen for a routine assessment by Mr. Elesa MassedWard PA-C and adjustments were made in his antihypertensive regimen.  ECG was also obtained, although based on discussion with Joyce GrossKay, Jacob Barton has not shown any obvious change in his current condition or voiced any complaints.  She states that he does ambulate and dress himself, but mainly watches television.  He is also a smoker.  No recent lab work or ECG provided for this  consultation.  Based on the records provided the interpretation of the ECG read "abnormal left axis deviation, right ventricular conduction delay, left ventricular hypertrophy, cannot rule out anterolateral infarct age undetermined ." Diagnosis of aortic stenosis noted in records is uncertain, I do not see any echocardiographic results.  I asked Joyce GrossKay to please fax a copy of the ECG in question to our office so that I can review it.   Past Medical History:  Diagnosis Date  . Anxiety and depression   . Aortic stenosis   . COPD (chronic obstructive pulmonary disease) (HCC)   . Essential hypertension   . Hypothyroidism   . PAD (peripheral artery disease) (HCC) 2007   Left femoral to below-knee popliteal bypass with a nonreverse greater saphenous vein  . Vascular dementia [facility](HCC)    Past Surgical History:  Procedure Laterality Date  . 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 Left ankle surgery    . Left below the knee to popliteal bypass       Current Meds  Medication Sig  . ADVAIR DISKUS 250-50 MCG/DOSE AEPB   . amLODipine (NORVASC) 5 MG tablet Take 5 mg by mouth daily.   Marland Kitchen. aspirin EC 81 MG tablet Take 81 mg by mouth daily.  . Cholecalciferol 10 MCG (400 UNIT) CAPS Take 2 capsules by mouth daily.  . ferrous sulfate 325 (65 FE) MG tablet Take 325 mg by mouth daily with breakfast.  . levothyroxine (SYNTHROID) 75 MCG tablet Take 75 mcg by mouth  daily before breakfast.   . lisinopril (ZESTRIL) 20 MG tablet Take 20 mg by mouth daily.  . Multiple Vitamins-Minerals (I-VITE PO) Take 1 tablet by mouth daily.  . VENTOLIN HFA 108 (90 Base) MCG/ACT inhaler      Allergies:   Penicillins   Social History   Tobacco Use  . Smoking status: Current Every Day Smoker    Types: Cigarettes  . Smokeless tobacco: Never Used  Substance Use Topics  . Alcohol use: Never    Frequency: Never  . Drug use: Never     Family Hx: The patient's family history includes CAD in his father and mother.   ROS:   Please see the history of present illness.    No review of systems was able to be obtained as the patient does not communicate.   Prior CV studies:   The following studies were reviewed today:  None found on chart review or provided.  Labs/Other Tests and Data Reviewed:    EKG: No old tracings available for review.  Recent Labs:  No lab work provided.  Wt Readings from Last 3 Encounters:  11/29/18 148 lb (67.1 kg)  07/28/17 162 lb 8 oz (73.7 kg)     Objective:    Vital Signs:  BP 128/84   Pulse 76   Resp 20   Wt 148 lb (67.1 kg)   BMI 21.24 kg/m    Vital signs reported to be most recent ones per Joyce Gross. I did not speak with the patient on the phone as he is noncommunicative.  ASSESSMENT & PLAN:    1.  Abnormal ECG per available records.  Unfortunately, the actual tracing was not sent for my review and this has been requested.  There is also no old tracing available.  My understanding is that this was obtained during a routine assessment, not necessarily in the presence of any new or developing symptomatology.  I will review the tracing and make further recommendations, but would anticipate overall conservative management aimed at control of blood pressure.  History provided of aortic stenosis based on available records, however uncertain when this was evaluated and to what degree.  Both high blood pressure and valvular heart disease could lead to ECG changes as described.  Although an echocardiogram could be arranged as an outpatient by referring provider, it is not clear that this would be pursued aggressively even if he did have evidence of progressive aortic stenosis.  2.  Essential hypertension.  Current blood pressure control looks adequate today based on review of provided measurements.  I reviewed his medications which are listed above.  Compliance is presumed as patient receives these at a facility.  3.  Vascular dementia based on provided information.  Patient  does not communicate based on discussion with Joyce Gross at the facility.  This obviously limits his evaluation.   Time:   Today, I have spent 15 minutes with the patient with telehealth technology discussing the above problems.     Medication Adjustments/Labs and Tests Ordered: Current medicines are reviewed at length with the patient today.  Concerns regarding medicines are outlined above.   Tests Ordered: No orders of the defined types were placed in this encounter.   Medication Changes: No orders of the defined types were placed in this encounter.   Disposition:  Follow up ECG when received.  Communication can then be sent to referring provider.  Signed, Nona Dell, MD  11/29/2018 10:03 AM    [facility]

## 2018-11-29 ENCOUNTER — Encounter: Payer: Self-pay | Admitting: Cardiology

## 2018-11-29 ENCOUNTER — Telehealth (INDEPENDENT_AMBULATORY_CARE_PROVIDER_SITE_OTHER): Payer: Medicare Other | Admitting: Cardiology

## 2018-11-29 VITALS — BP 128/84 | HR 76 | Resp 20 | Wt 148.0 lb

## 2018-11-29 DIAGNOSIS — I10 Essential (primary) hypertension: Secondary | ICD-10-CM | POA: Diagnosis not present

## 2018-11-29 DIAGNOSIS — I350 Nonrheumatic aortic (valve) stenosis: Secondary | ICD-10-CM

## 2018-11-29 DIAGNOSIS — R9431 Abnormal electrocardiogram [ECG] [EKG]: Secondary | ICD-10-CM | POA: Diagnosis not present

## 2021-05-17 ENCOUNTER — Encounter: Payer: Self-pay | Admitting: Nurse Practitioner

## 2021-05-17 ENCOUNTER — Other Ambulatory Visit: Payer: Self-pay

## 2021-05-17 ENCOUNTER — Non-Acute Institutional Stay: Payer: Medicare Other | Admitting: Nurse Practitioner

## 2021-05-17 DIAGNOSIS — R630 Anorexia: Secondary | ICD-10-CM

## 2021-05-17 DIAGNOSIS — Z515 Encounter for palliative care: Secondary | ICD-10-CM

## 2021-05-17 DIAGNOSIS — F0150 Vascular dementia without behavioral disturbance: Secondary | ICD-10-CM

## 2021-05-17 NOTE — Progress Notes (Signed)
[facility] Consult Note Telephone: [phone]  Fax: [phone]   Date of encounter: 05/17/21 9:33 PM PATIENT NAME: Jacob Barton [address] [place] [address]   [phone] (home)  DOB: 1940/10/05 MRN: [id number] PRIMARY CARE PROVIDER:    [place]  RESPONSIBLE PARTY:    Contact Information     Name Relation Home Work Mobile   [place] Other [phone]        I met face to face with patient in facility. Palliative Care was asked to follow this patient by consultation request of  No ref. provider found to address advance care planning and complex medical decision making. This is the initial visit.  ASSESSMENT AND PLAN / RECOMMENDATIONS:  Symptom Management/Plan: 1. Advance Care Planning;   2. 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 Palliative care encounter; Palliative care encounter; Palliative medicine team will continue to support patient, patient's family, and medical team. Visit consisted of counseling and education dealing with the complex and emotionally intense issues of symptom management and palliative care in the setting of serious and potentially life-threatening illness  4. f/u 1 month for ongoing monitoring chronic disease progression, ongoing discussions complex medical decision making  Follow up Palliative Care Visit: Palliative care will continue to follow for complex medical decision making, advance care planning, and clarification of goals. Return 4 weeks or prn.  I spent 67 minutes providing this consultation. More than 50% of the time in this consultation was spent in counseling and care coordination. PPS: 40%  Chief Complaint: Initial palliative consult for  complex medical decision making  HISTORY OF PRESENT ILLNESS:  Jacob Barton is a 80 y.o. year old male  with multiple medical problems including vascular dementia, aortic stenosis, COPD, HTN, hypothyroidism, PAD, anxiety, depression. Mr. Spickler resides at [facility]. Mr. Nunn requires assistance from staff for mobility, transfers, adl's including bathing, dressing, toileting. Mr. Russomanno feeds himself after tray setup. Mr. Olliff is a full code. Staff endorses no recent hospitalizations, wounds, falls, infections. At present, Mr. Bulthuis is sitting in the w/c eating his lunch. Mr. Manke appears debility, chronically ill overall with cognitive impairment. Mr. Gudino did make eye contact. Limited discussion due to cognitive impairment. Mr. Wahlstrom was able to answer simple questions ros. Mr. Schollmeyer was cooperative with assessment. Emotional support provided. I attempted to contact RP listed which was [facility], will need further information as that person is no longer employed and did not have Mr. Mollica name. Medical goals reviewed. Will f/u with staff for further contact information for goc, address code status.   History obtained from review of EMR, discussion with facility staff and Mr. Barthold.  I reviewed available labs, medications, imaging, studies and related documents from the EMR.  Records reviewed and summarized above.   ROS Full 10 system review of systems performed and negative with exception of: as per HPI.   Physical Exam: Constitutional: NAD General: frail appearing, thin, debilitated, cognitively impaired male EYES: lids intact ENMT: oral mucous membranes moist CV: S1S2, RRR Pulmonary: LCTA, no increased work of breathing, no cough Abdomen:  normo-active BS + 4 quadrants, soft and non tender MSK: w/c dependent Skin: warm and dry Neuro:  + generalized weakness,  + cognitive impairment Psych: flat affect,  Alert, oriented to self  PAST MEDICAL HISTORY:  Active  Ambulatory Problems    Diagnosis Date Noted   No Active Ambulatory Problems   Resolved Ambulatory Problems    Diagnosis Date Noted   No Resolved Ambulatory Problems   Past Medical History:  Diagnosis Date   Anxiety and depression    Aortic stenosis    COPD (chronic obstructive pulmonary disease) (HCC)    Essential hypertension    Hypothyroidism    PAD (peripheral artery disease) ([place]) 2007   Vascular dementia ([place])    SOCIAL HX:  Social History   Tobacco Use   Smoking status: Every Day    Types: Cigarettes   Smokeless tobacco: Never  Substance Use Topics   Alcohol use: Never   FAMILY HX:  Family History  Problem Relation Age of Onset   CAD Mother    CAD Father     reviewed  ALLERGIES:  Allergies  Allergen Reactions   Penicillins Rash     PERTINENT MEDICATIONS:  Outpatient Encounter Medications as of 05/17/2021  Medication Sig   ADVAIR DISKUS 250-50 MCG/DOSE AEPB    amLODipine (NORVASC) 5 MG tablet Take 5 mg by mouth daily.    aspirin EC 81 MG tablet Take 81 mg by mouth daily.   Cholecalciferol 10 MCG (400 UNIT) CAPS Take 2 capsules by mouth daily.   ferrous sulfate 325 (65 FE) MG tablet Take 325 mg by mouth daily with breakfast.   levothyroxine (SYNTHROID) 75 MCG tablet Take 75 mcg by mouth daily before breakfast.    lisinopril (ZESTRIL) 20 MG tablet Take 20 mg by mouth daily.   Multiple Vitamins-Minerals (I-VITE PO) Take 1 tablet by mouth daily.   VENTOLIN HFA 108 (90 Base) MCG/ACT inhaler    No facility-administered encounter medications on file as of 05/17/2021.  Questions and concerns were addressed. The patient/facility staff was encouraged to call with questions and/or concerns. My business card was provided. Provided general support and encouragement, no other unmet needs identified  Thank you for the opportunity to participate in the care of Mr. Durden.  The palliative care team will continue to follow. Please call our office at [phone] if we can be  of additional assistance.   This chart was dictated using voice recognition software.  Despite best efforts to proofread,  errors can occur which can change the documentation meaning.   Audry Pecina Z Orvile Corona, NP ,

## 2021-07-05 ENCOUNTER — Non-Acute Institutional Stay: Payer: Medicare Other | Admitting: Nurse Practitioner

## 2021-07-05 ENCOUNTER — Encounter: Payer: Self-pay | Admitting: Nurse Practitioner

## 2021-07-05 DIAGNOSIS — F0150 Vascular dementia without behavioral disturbance: Secondary | ICD-10-CM

## 2021-07-05 DIAGNOSIS — R630 Anorexia: Secondary | ICD-10-CM

## 2021-07-05 DIAGNOSIS — Z515 Encounter for palliative care: Secondary | ICD-10-CM

## 2021-07-05 NOTE — Progress Notes (Addendum)
[place] Consult Note Telephone: [phone]  Fax: [phone]    Date of encounter: 07/05/21 8:09 PM PATIENT NAME: Jacob Barton [address] [place] [address]   [phone] (home)  DOB: 1940/11/11 MRN: [id number] PRIMARY CARE PROVIDER:    [facility]  RESPONSIBLE PARTY:    Contact Information     Name Relation Home Work [place] Other [phone]        I met face to face with patient in facility. Palliative Care was asked to follow this patient by consultation request of  [facility] to address advance care planning and complex medical decision making. This is a follow up visit.                                  ASSESSMENT AND PLAN / RECOMMENDATIONS:  Symptom Management/Plan: 1. Advance Care Planning; full code; difficulty getting in touch with RP   2. 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 Palliative care encounter; Palliative care encounter; Palliative medicine team will continue to support patient, patient's family, and medical team. Visit consisted of counseling and education dealing with the complex and emotionally intense issues of symptom management and palliative care in the setting of serious and potentially life-threatening illness   4. f/u 1 month for ongoing monitoring chronic disease progression, ongoing discussions complex medical decision making   Follow up Palliative Care Visit: Palliative care will continue to follow for complex medical decision making, advance care planning, and clarification of goals. Return 4 weeks or prn. Follow up Palliative Care Visit: Palliative care will continue to follow for  complex medical decision making, advance care planning, and clarification of goals. Return 4 weeks or prn.  I spent 48 minutes providing this consultation. More than 50% of the time in this consultation was spent in counseling and care coordination.  PPS: 40%  Chief Complaint: Follow up palliative consult for complex medical decision making  HISTORY OF PRESENT ILLNESS:  Jacob Barton is a 80 y.o. year old male  with multiple medical problems including vascular dementia, aortic stenosis, COPD, HTN, hypothyroidism, PAD, anxiety, depression. Jacob Barton resides at [facility]. Jacob Barton requires assistance from staff for mobility, transfers, adl's including bathing, dressing, toileting. Jacob Barton feeds himself after tray setup. Jacob Barton is a full code. Staff endorses no recent hospitalizations, wounds, falls, infections. At present, Jacob Barton is sitting in the day room. Jacob Barton does make eye contact with word salad. Jacob Barton does smile attempting to interact. Jacob Barton was cooperative with assessment. No meaningful discussion due to cognitive impairment. Emotional support provided. I updated staff; will continue to monitor, have hospice physician review cae as with appears continuing progressive decline in the setting of dementia.   History obtained from review of EMR, discussion with facility staff and  Jacob Barton.  I reviewed available labs, medications, imaging, studies and related documents from the EMR.  Records reviewed and summarized above.   ROS Full 10 system review of systems performed and negative with exception of: as per HPI.   Physical Exam: Constitutional: NAD General: frail appearing, thin cognitively  impaired male EYES: a lids intact ENMT: oral mucous membranes moist CV: S1S2, RRR Pulmonary: LCTA, no increased work of breathing, no cough, room air Abdomen: normo-active BS + 4 quadrants, soft and non tender GU: deferred MSK: w/c dependent; muscle wasting Skin:  warm and dry Neuro:  + generalized weakness,  + cognitive impairment Psych: non-anxious affect, Alert  Questions and concerns were addressed. Provided general support and encouragement, no other unmet needs identified   Thank you for the opportunity to participate in the care of Jacob Barton.  The palliative care team will continue to follow. Please call our office at [phone] if we can be of additional assistance.   This chart was dictated using voice recognition software.  Despite best efforts to proofread,  errors can occur which can change the documentation meaning.   Regene Mccarthy Ihor Gully, NP

## 2021-07-08 ENCOUNTER — Other Ambulatory Visit: Payer: Self-pay

## 2021-07-29 ENCOUNTER — Other Ambulatory Visit: Payer: Self-pay

## 2021-07-29 ENCOUNTER — Non-Acute Institutional Stay: Payer: Medicare Other | Admitting: Nurse Practitioner

## 2021-07-29 ENCOUNTER — Encounter: Payer: Self-pay | Admitting: Nurse Practitioner

## 2021-07-29 VITALS — Resp 18 | Wt 128.0 lb

## 2021-07-29 DIAGNOSIS — F0150 Vascular dementia without behavioral disturbance: Secondary | ICD-10-CM

## 2021-07-29 DIAGNOSIS — Z515 Encounter for palliative care: Secondary | ICD-10-CM

## 2021-07-29 DIAGNOSIS — R630 Anorexia: Secondary | ICD-10-CM

## 2021-07-29 NOTE — Progress Notes (Signed)
[place] Consult Note Telephone: [phone]  Fax: [phone]    Date of encounter: 07/29/21 8:14 PM PATIENT NAME: Jacob Barton [address] [place] [address]   [phone] (home)  DOB: 02/15/1941 MRN: [id number] PRIMARY CARE PROVIDER:    [place]  RESPONSIBLE PARTY:    Contact Information     Name Relation Home Work Mobile   [place] Other [phone]        I met face to face with patient in facility. Palliative Care was asked to follow this patient by consultation request of  [place] to address advance care planning and complex medical decision making. This is a follow up visit.                                  ASSESSMENT AND PLAN / RECOMMENDATIONS:  Symptom Management/Plan: 1. Advance Care Planning; full code will need to discuss goc with guardian   2. 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 Palliative care encounter; Palliative care encounter; Palliative medicine team will continue to support patient, patient's family, and medical team. Visit consisted of counseling and education dealing with the complex and emotionally intense issues of symptom management and palliative care in the setting of serious and potentially life-threatening illness   4. f/u 1 month for ongoing monitoring chronic disease progression, ongoing discussions complex medical decision making  Follow up Palliative Care Visit: Palliative care will continue to follow for complex medical decision making, advance care planning, and clarification of goals. Return 4 weeks or prn.  I spent 28 minutes providing this consultation. More  than 50% of the time in this consultation was spent in counseling and care coordination. PPS: 40%  Chief Complaint: Follow up palliative consult for complex medical decision making  HISTORY OF PRESENT ILLNESS:  Jacob Barton is a 81 y.o. year old male  with multiple medical problems including vascular dementia, aortic stenosis, COPD, HTN, hypothyroidism, PAD, anxiety, depression. Jacob Barton resides at [facility]. Jacob Barton requires assistance from staff for mobility, transfers, adl's including bathing, dressing, toileting. Jacob Barton feeds himself after tray setup. Jacob Barton is a full code. Staff endorses no recent hospitalizations, wounds, falls, infections. At present, Jacob Barton is sitting in the w/c eating his lunch. Jacob Barton appears debility, chronically ill overall with cognitive impairment. Jacob Barton did make eye contact. Limited discussion due to cognitive impairment. Jacob Barton was limited with responses, more word salad. Mr Barton attempted to be interactive, though no contact. Jacob Barton was cooperative with assessment. Emotional support provided. I attempted to contact RP listed which was DSS [facility], will need further information as that person is no longer employed and did not have Mr. Cocozza name. Medical goals reviewed. Will f/u with staff for further contact information for goc, address code status.   History obtained from review of EMR, discussion with facility staff and Mr. Arnold.  I reviewed available labs, medications, imaging, studies and related documents from the EMR.  Records reviewed and summarized above.   ROS 10 point system reviewed with staff, all negative except HPI  Physical Exam: Constitutional:  NAD General: frail appearing, thin, confused male EYES: lids intact ENMT: oral mucous membranes moist CV: S1S2, RRR, no LE edema Pulmonary: LCTA, no increased work of breathing, no cough, room air Abdomen: normo-active BS + 4 quadrants, soft and non  tender MSK: assistance with walker; muscle wasting Skin: warm and dry Neuro:  + generalized weakness,  + cognitive impairment Psych: non-anxious affect, A and confused Thank you for the opportunity to participate in the care of Jacob Barton.  The palliative care team will continue to follow. Please call our office at [phone] if we can be of additional assistance.   Questions and concerns were addressed. Provided general support and encouragement, no other unmet needs identified   This chart was dictated using voice recognition software.  Despite best efforts to proofread,  errors can occur which can change the documentation meaning.   Marketta Valadez Ihor Gully, NP

## 2021-08-21 ENCOUNTER — Encounter: Payer: Self-pay | Admitting: Nurse Practitioner

## 2021-08-21 ENCOUNTER — Non-Acute Institutional Stay: Payer: Medicare Other | Admitting: Nurse Practitioner

## 2021-08-21 VITALS — HR 78 | Resp 18 | Wt 128.0 lb

## 2021-08-21 DIAGNOSIS — F0150 Vascular dementia without behavioral disturbance: Secondary | ICD-10-CM

## 2021-08-21 DIAGNOSIS — Z515 Encounter for palliative care: Secondary | ICD-10-CM

## 2021-08-21 DIAGNOSIS — R630 Anorexia: Secondary | ICD-10-CM

## 2021-08-21 NOTE — Progress Notes (Signed)
[facility] Consult Note Telephone: [phone]  Fax: [phone]    Date of encounter: 08/21/21 6:54 PM PATIENT NAME: Jacob Barton [address] [place] [address]   [phone] (home)  DOB: [birth year] MRN: [id number] PRIMARY CARE PROVIDER:    [facility]  RESPONSIBLE PARTY:    Contact Information     Name Relation Home Work Mobile   [place] Other [phone]        I met face to face with patient in facility. Palliative Care was asked to follow this patient by consultation request of  [facility] to address advance care planning and complex medical decision making. This is a follow up visit.                                  ASSESSMENT AND PLAN / RECOMMENDATIONS:  Symptom Management/Plan: 1. Advance Care Planning; full code will need to discuss goc with guardian remains pending   2. 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 Palliative care encounter; Palliative care encounter; Palliative medicine team will continue to support patient, patient's family, and medical team. Visit consisted of counseling and education dealing with the complex and emotionally intense issues of symptom management and palliative care in the setting of serious and potentially life-threatening illness   4. f/u 1 month for ongoing monitoring chronic disease progression, ongoing discussions complex medical decision making  Follow up Palliative Care Visit: Palliative care will continue to follow for complex medical decision making, advance care planning, and clarification of goals. Return 4 weeks or prn.  I spent 41 minutes providing this  consultation. More than 50% of the time in this consultation was spent in counseling and care coordination. PPS: 40%  Chief Complaint: Follow up palliative consult for complex medical decision making  HISTORY OF PRESENT ILLNESS:  Jacob Barton is a 81 y.o. year old male  with multiple medical problems including vascular dementia, aortic stenosis, COPD, HTN, hypothyroidism, PAD, anxiety, depression. Mr. Plouff resides at [employer] [facility]. Mr. Auriemma requires assistance from staff for mobility, transfers, adl's including bathing, dressing, toileting. Mr. Daughtridge feeds himself after tray setup. Mr. Barnhill is a full code. Staff endorses no recent hospitalizations, wounds, falls, infections. At present, Mr. Jaquith is sitting in tchair in the dayroom sleeping. Mr. Pitner did awake with verbal cues, making brief eye contact, mumbled, then returned to sleep. Mr. Curley appears debility, chronically ill overall with cognitive impairment. Limited discussion due to cognitive impairment. Mr. Uhlig was cooperative with assessment. Emotional support provided. I attempted to contact RP listed which was [facility] [employer], will need further information as that person is no longer employed and did not have Mr. Livesey name. Medical goals reviewed. Received contact information for goc, address code status.  Marland Kitchen   History obtained from review of EMR, discussion with facility staff with Mr. Montelongo.  I reviewed available labs, medications, imaging, studies and related documents from the EMR.  Records reviewed and summarized above.   ROS 10 point system reviewed with staff, all negative except HPI  Physical Exam: Constitutional: NAD General: frail appearing, thin, confused, debilitated male EYES:  lids intact ENMT: oral mucous membranes moist CV: S1S2, RRR, no LE edema Pulmonary: LCTA, no increased work of breathing, no cough, room air Abdomen:  normo-active BS + 4 quadrants, soft and non tender MSK: w/c dependent;  +muscle wasting Skin: warm and dry Neuro:  + generalized weakness,  + cognitive impairment Psych: non-anxious affect, Alert, confused Thank you for the opportunity to participate in the care of Mr. Gertz.  The palliative care team will continue to follow. Please call our office at [phone] if we can be of additional assistance.   Questions and concerns were addressed. Provided general support and encouragement, no other unmet needs identified   Nidhi Jacome Ihor Gully, NP

## 2021-08-22 ENCOUNTER — Other Ambulatory Visit: Payer: Self-pay

## 2021-10-11 ENCOUNTER — Non-Acute Institutional Stay: Payer: Medicare Other | Admitting: Nurse Practitioner

## 2021-10-11 ENCOUNTER — Encounter: Payer: Self-pay | Admitting: Nurse Practitioner

## 2021-10-11 DIAGNOSIS — R630 Anorexia: Secondary | ICD-10-CM

## 2021-10-11 DIAGNOSIS — Z515 Encounter for palliative care: Secondary | ICD-10-CM

## 2021-10-11 DIAGNOSIS — F0150 Vascular dementia without behavioral disturbance: Secondary | ICD-10-CM

## 2021-10-11 NOTE — Progress Notes (Signed)
? ? ?[facility] ?[facility] Consult Note ?Telephone: [phone]  ?Fax: [phone]  ? ? ?Date of encounter: 10/11/21 ?5:59 PM ?PATIENT NAME: Jacob Barton ?[address] ?[place] [address]   ?[phone] (home)  ?DOB: 1940/12/14 ?MRN: [id number] ?PRIMARY CARE PROVIDER:    ?Jacob Barton ? ?RESPONSIBLE PARTY:    ?Contact Information   ? ? Name Relation Home Work Mobile  ? Jacob Barton Other [phone]    ? ?  ? ?I met face to face with patient in facility. Palliative Care was asked to follow this patient by consultation request of  Jacob Barton to address advance care planning and complex medical decision making. This is a follow up visit.                                 ?ASSESSMENT AND PLAN / RECOMMENDATIONS:  ?Symptom Management/Plan: ?1. Advance Care Planning; full code will need to discuss goc with guardian remains pending ?  ?2. 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 ?Today; Right arm 21.5 cm ?Today Right leg 37 cm ?  ?3. Palliative care encounter; Palliative care encounter; Palliative medicine team will continue to support patient, patient's family, and medical team. Visit consisted of counseling and education dealing with the complex and emotionally intense issues of symptom management and palliative care in the setting of serious and potentially life-threatening illness ?  ?4. f/u 1 month for ongoing monitoring chronic disease progression, ongoing discussions complex medical decision making ? ?Follow up Palliative Care Visit: Palliative care will continue to follow for complex medical decision making, advance care planning, and  clarification of goals. Return 4 weeks or prn. ? ?I spent 43 minutes providing this consultation. More than 50% of the time in this consultation was spent in counseling and care coordination. ?PPS: 30% ? ?Chief Complaint: Follow up palliative consult for complex medical decision making ? ?HISTORY OF PRESENT ILLNESS:  AHARIM BIis a 81y.o. year old male  with multiple medical problems including vascular dementia, aortic stenosis, COPD, HTN, hypothyroidism, PAD, anxiety, depression. Mr. Jacob Barton at [facility] Mr. Jacob Barton assistance from staff for mobility, transfers, adl's including bathing, dressing, toileting. [name]. [name] a full code. Staff endorses no recent hospitalizations, wounds, falls, infections. At present, Jacob Barton sitting in tchair in the dayroom sleeping. Jacob Barton awake with verbal cues, making brief eye contact, mumbled, attempted to interact. Jacob Barton debility, chronically ill overall with cognitive impairment. Limited discussion due to cognitive impairment. Jacob Barton cooperative with assessment. Emotional support provided. I attempted to contact RP listed which was [facility] [place], for further discussions of goc, address code status. updated staff  .  ? ?History obtained from review of EMR, discussion with facility staff and  Jacob Barton  ?I reviewed available labs, medications, imaging, studies and related documents from the EMR.  Records reviewed and summarized above.  ? ?ROS ?10 point system reviewed with staff as Mr Barton cognitively impaired all negative except HPI ? ?Physical Exam: ?Constitutional: NAD ?General: frail appearing, thin, debilitated, confused  male ?EYES: lids intact ?ENMT:  oral mucous membranes moist ?CV: S1S2, RRR, no LE edema ?Pulmonary: LCTA, no increased work of breathing, no cough, room air ?Abdomen: isoft and non tender ?MSK: w/c dependent; +muscle wasting ?Skin: warm and  dry ?Neuro:  + generalized weakness,  + cognitive impairment ?Psych: flat affect, Alert, confused ?Thank you for the opportunity to participate in the care of Jacob Barton.  The palliative care team will continue to follow. Please call our office at [phone] if we can be of additional assistance.  ? ?Jacob Barton Z Jacob Dekay, NP   ?

## 2021-10-14 ENCOUNTER — Other Ambulatory Visit: Payer: Self-pay

## 2022-02-18 DEATH — deceased
